# Patient Record
Sex: Male | Born: 1980 | Race: Black or African American | Hispanic: No | Marital: Single | Smoking: Current some day smoker
Health system: Southern US, Community
[De-identification: ages and names within clinical notes are randomized; demographics above are authoritative.]

---

## 2007-01-31 ENCOUNTER — Inpatient Hospital Stay (HOSPITAL_COMMUNITY): Admission: EM | Admit: 2007-01-31 | Discharge: 2007-02-02 | Payer: Self-pay | Admitting: Otolaryngology

## 2007-01-31 ENCOUNTER — Encounter: Payer: Self-pay | Admitting: Emergency Medicine

## 2007-01-31 ENCOUNTER — Encounter (INDEPENDENT_AMBULATORY_CARE_PROVIDER_SITE_OTHER): Payer: Self-pay | Admitting: Otolaryngology

## 2008-05-12 IMAGING — CT CT MAXILLOFACIAL W/O CM
1 series · 15 of 30 positions shown, 19 images · non-contrast
Comparison: CT face with contrast from 1001 hours the same day.

CLINICAL DATA: 26 year old male with suspected left maxillary sinusitis and superinfection of the inferior left orbit with history of prior left infraorbital trauma and plate reconstruction.  
MAXILLOFACIAL CT WITHOUT CONTRAST ? 01/31/07:
TECHNIQUE: Coronal and axial CT images were obtained through the maxillofacial region including the facial bones, orbits, and paranasal sinuses.  No intravenous contrast was administered.

[Series 2: stealth · axial · 0.49mm/px · z∈[+22,+190]mm · 15 of 144 slices shown, 19 images]
[im 5/144  brain]
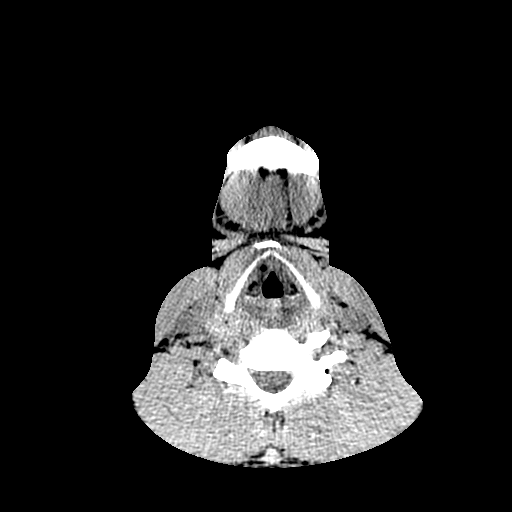
[im 5/144  bone]
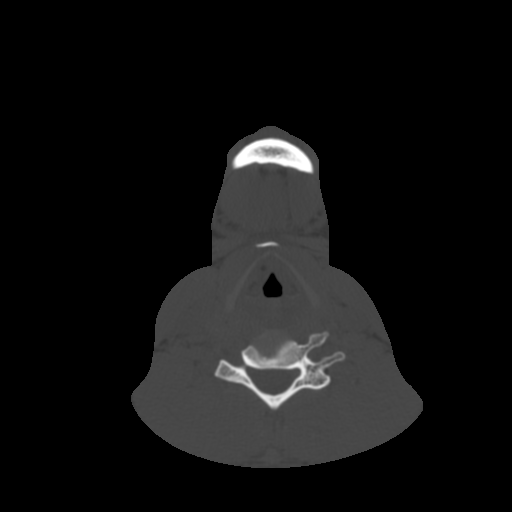
[im 15/144  bone]
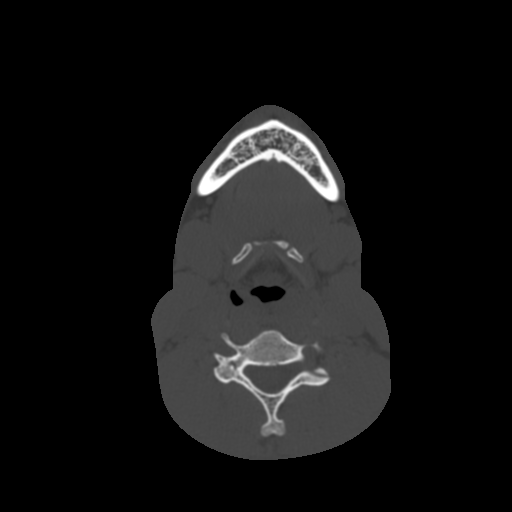
[im 25/144  bone]
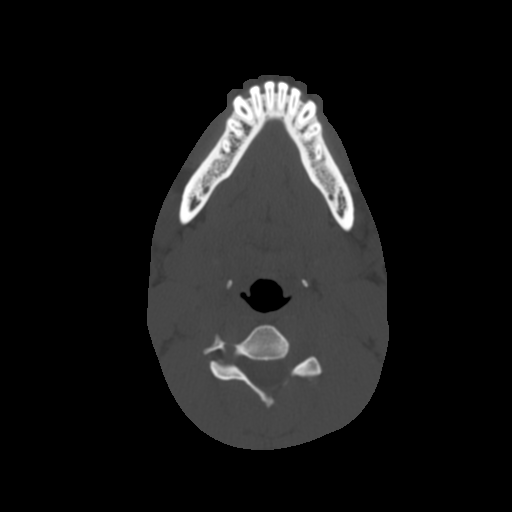
[im 35/144  bone]
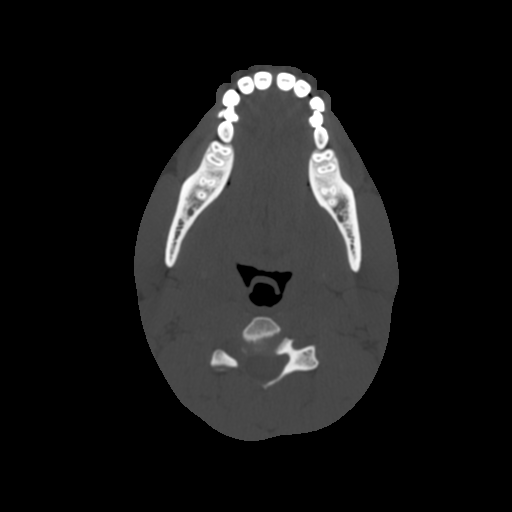
[im 45/144  brain]
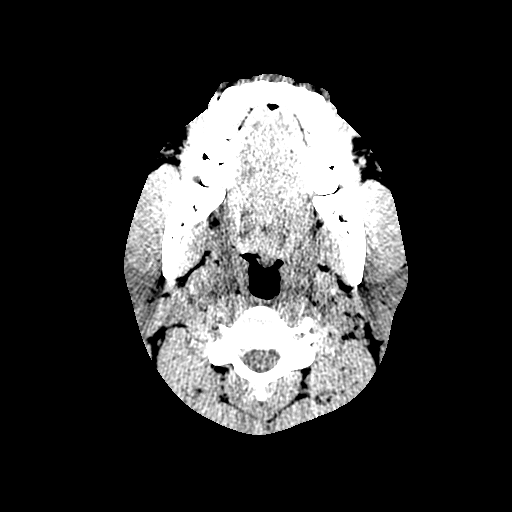
[im 45/144  bone]
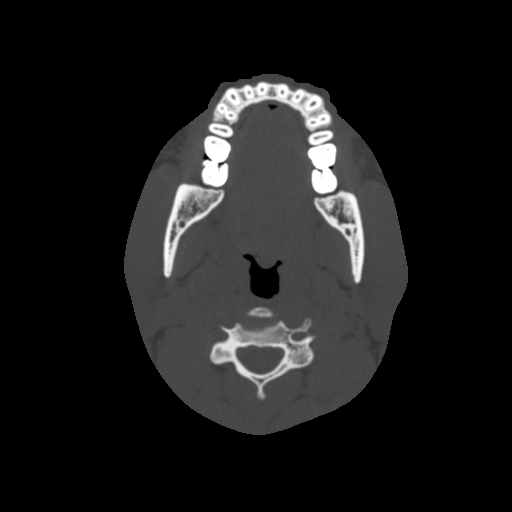
[im 55/144  bone]
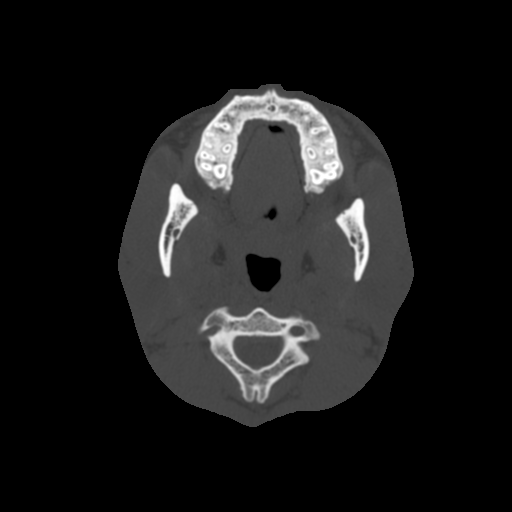
[im 65/144  bone]
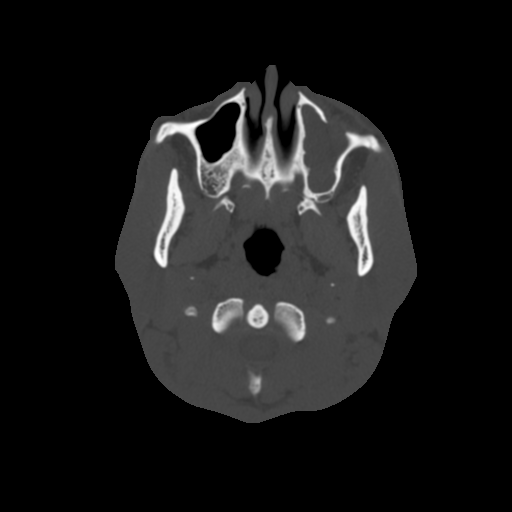
[im 74/144  bone]
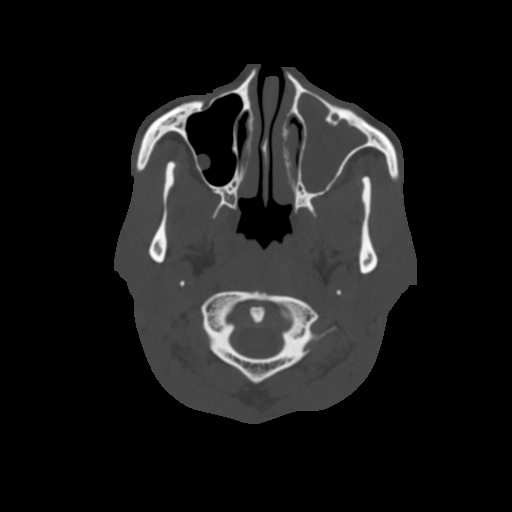
[im 79/144  brain]
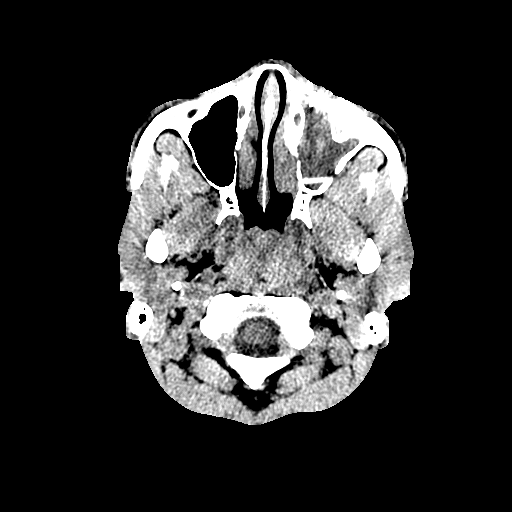
[im 79/144  bone]
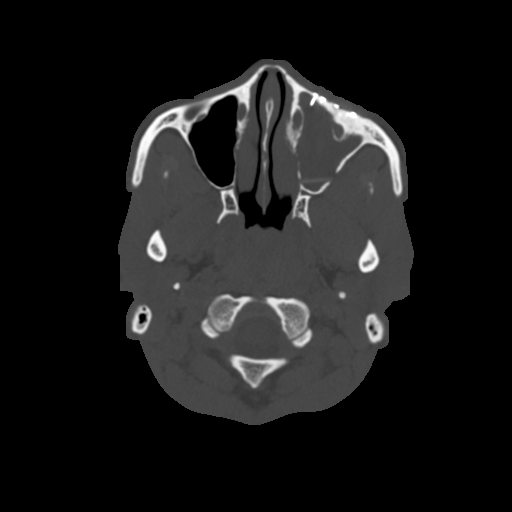
[im 89/144  bone]
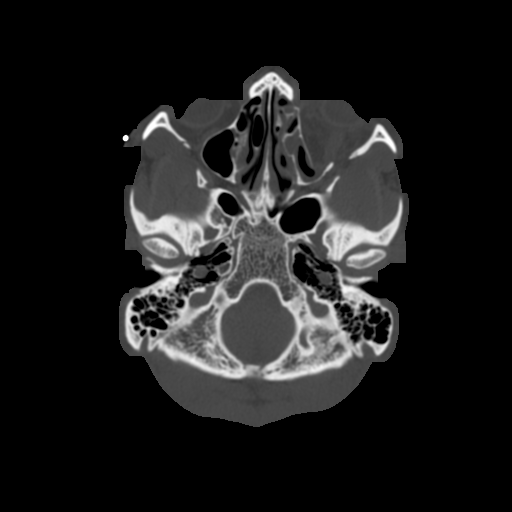
[im 99/144  bone]
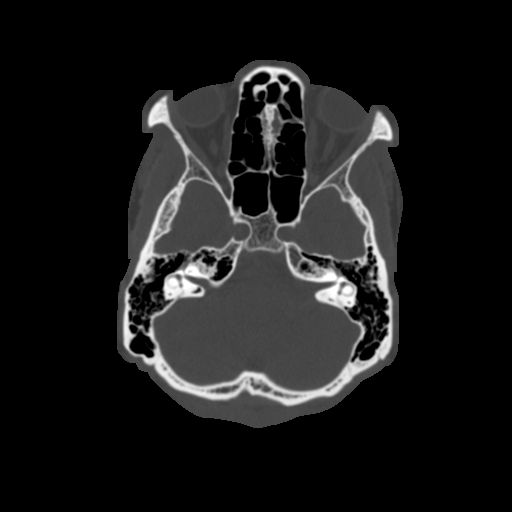
[im 109/144  bone]
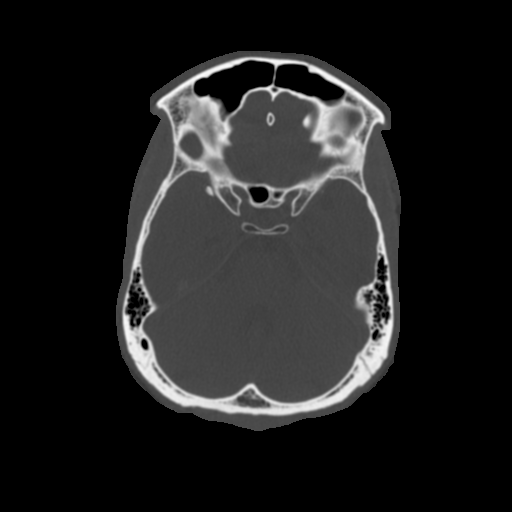
[im 119/144  brain]
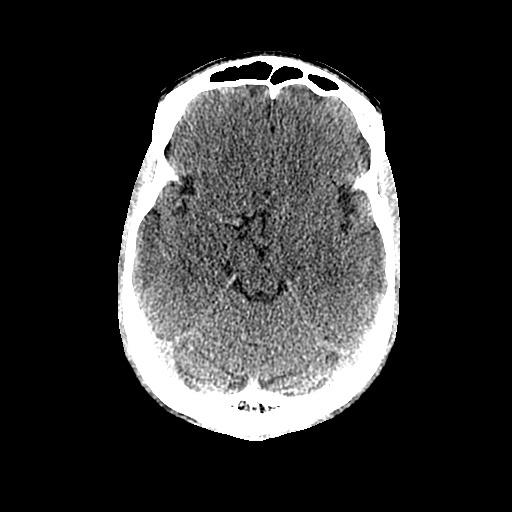
[im 119/144  bone]
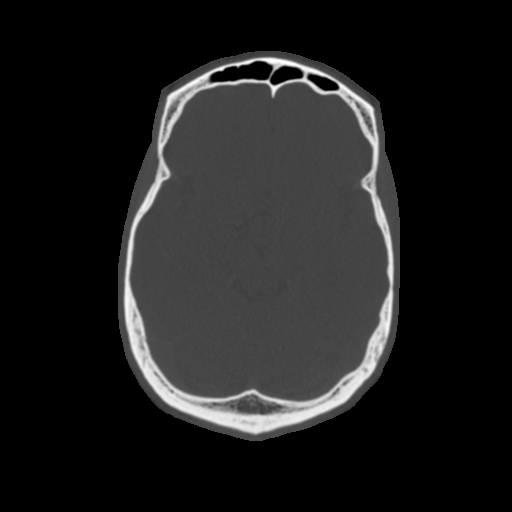
[im 129/144  bone]
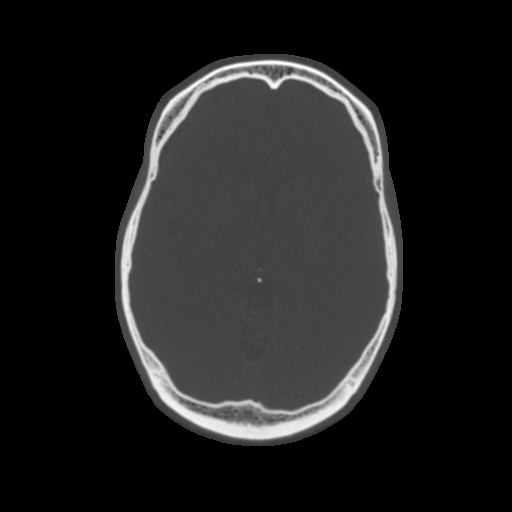
[im 139/144  bone]
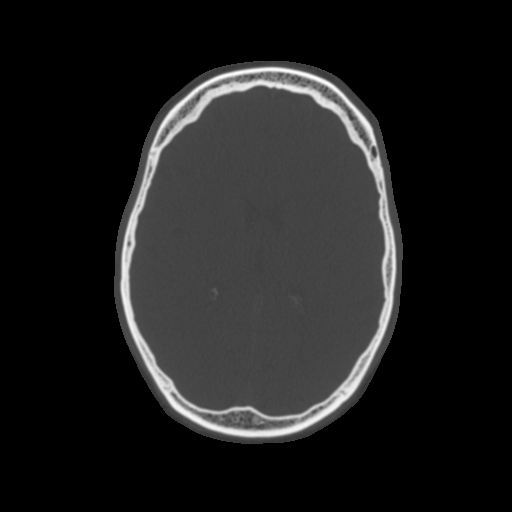

[15 of 30 positions shown; findings below may reference images not displayed]

FINDINGS: This noncontrasted study demonstrates complete opacification of the left maxillary sinus.  There is an osseous defect in the anterior aspect of the sinus which suggests a prior Deeqa Rayaan. The plate fixation of the left inferior orbit is reidentified along with some hyperdense material which may not be metallic which extends into the posterior-superior aspect of the left maxillary sinus.  Inflammatory changes involving the inferior rectus muscle on the left and inferior postseptal orbit were more apparent on the prior examination given coronal reformations.  The globes are intact and symmetric. The remainder of the left orbit appears within normal limits.  There is left premalar soft tissue swelling which is stable.  
A small mucus retention cyst in the right maxillary sinus is unchanged.  Left inferior ethmoid air cell opacification is stable. The remaining paranasal sinuses and mastoids are clear.  Visualized brain parenchyma remains normal.  The nasopharynx, oropharynx, hypopharynx, and larynx are normal.  Scattered cervical lymph nodes are not enlarged by CT size criteria.  Outside of the left inferior orbit and maxillary sinus, the visualized osseous structures are normal.
IMPRESSION: 1.  Stable left maxillary sinus opacification and inferior left orbital inflammatory changes surrounding the plate fixation of the left orbital floor fracture.
2.  Osseous defect in the anterior left maxillary sinus suggestive of prior Deeqa Rayaan procedure.

## 2008-05-12 IMAGING — CT CT MAXILLOFACIAL W/ CM
3 series · 16 of 41 positions shown, 19 images · IV contrast (omnipaque)
Comparison: none

CLINICAL DATA: History of inferior orbital wall blowout fracture on the left
about 5 years ago with surgical repair and  plating. Now with left eye swelling
and blurry vision.

MAXILLOFACIAL CT WITH CONTRAST
TECHNIQUE: Axial and coronal plane CT imaging was performed through the
maxillofacial structures following intravenous contrast administration.
Contrast:  100 cc Omnipaque 300

[Series 602: <mpr range> · coronal · 0.33mm/px · 3 of 108 slices shown]
[im 46/108  bone]
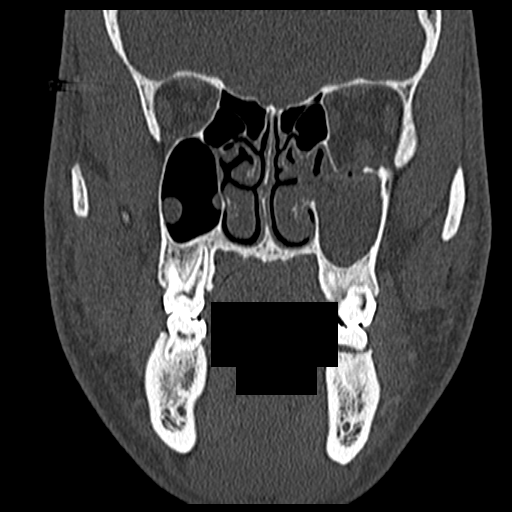
[im 54/108  bone]
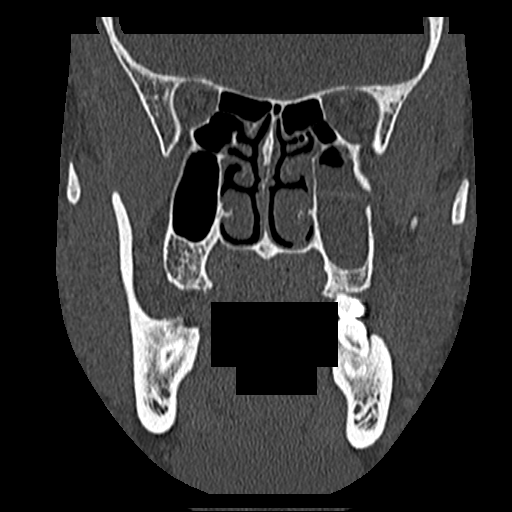
[im 62/108  bone]
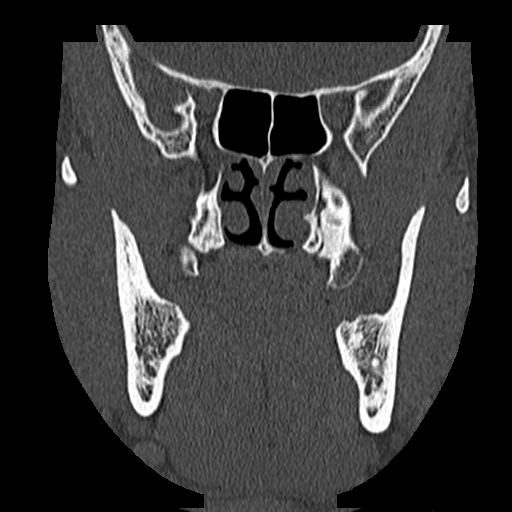

[Series 603: <mpr range(1)> · coronal · 0.33mm/px · 10 of 125 slices shown, 13 images]
[im 14/125  brain]
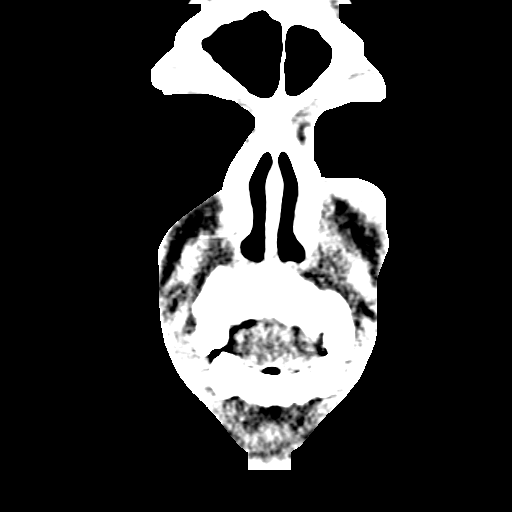
[im 14/125  bone]
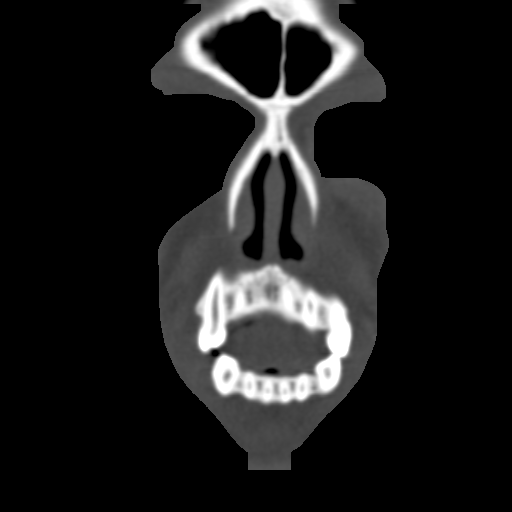
[im 28/125  bone]
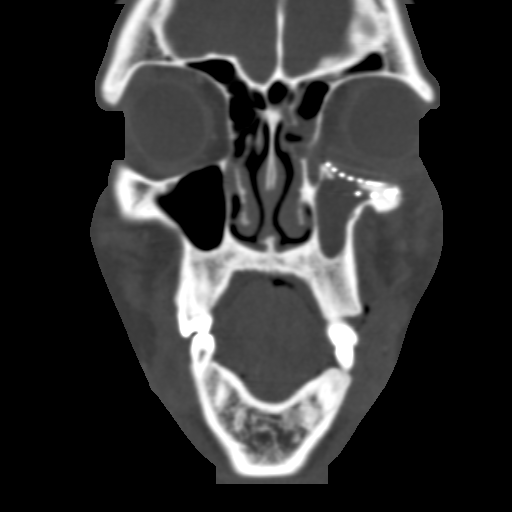
[im 32/125  bone]
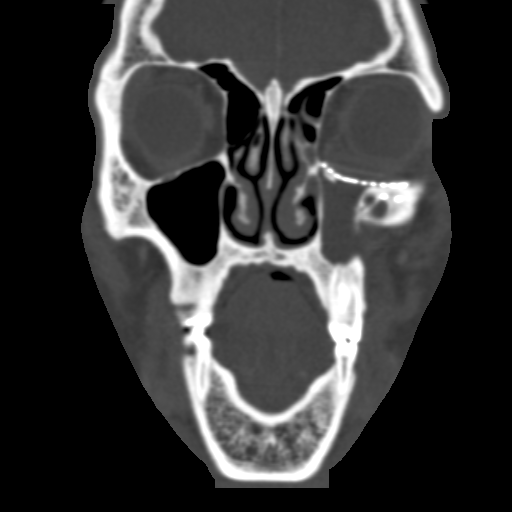
[im 42/125  bone]
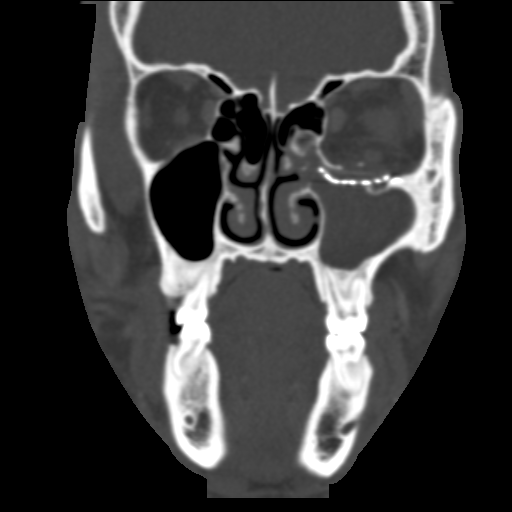
[im 56/125  brain]
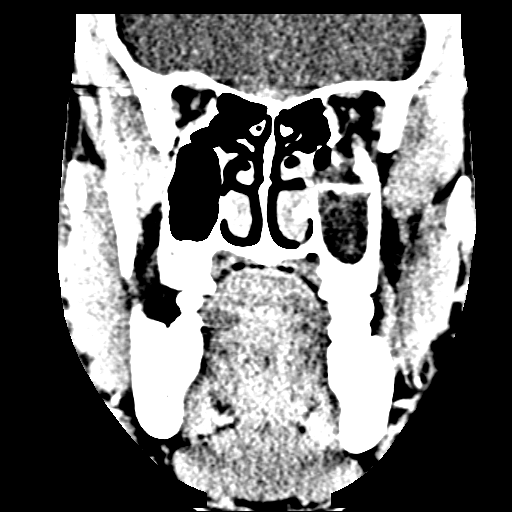
[im 56/125  bone]
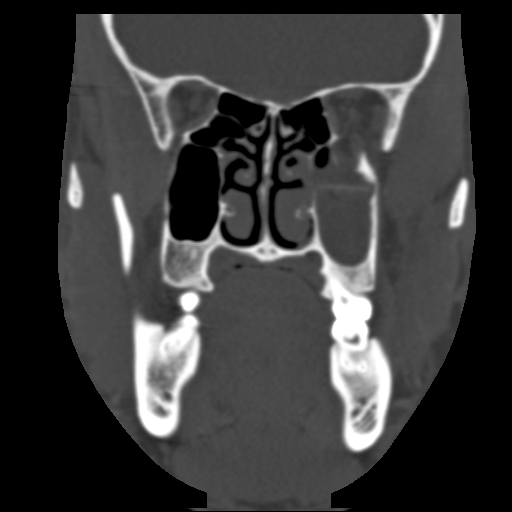
[im 69/125  bone]
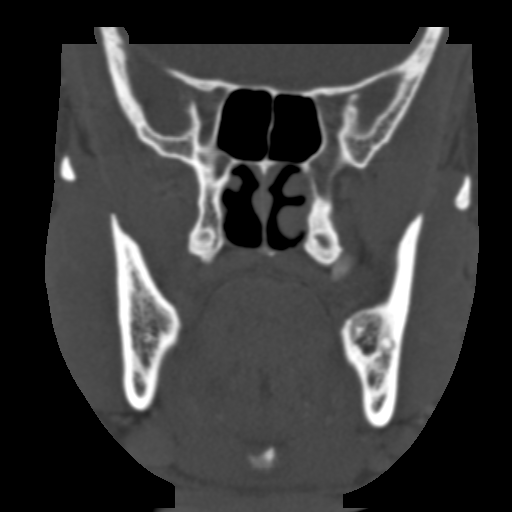
[im 83/125  bone]
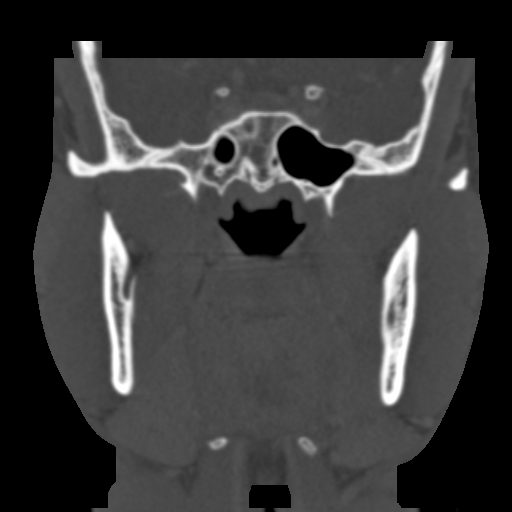
[im 94/125  bone]
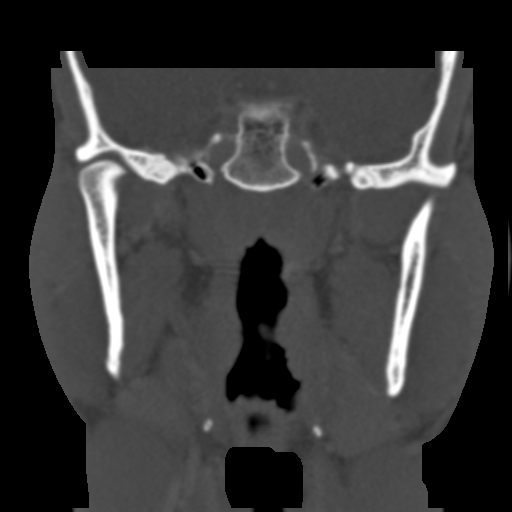
[im 97/125  brain]
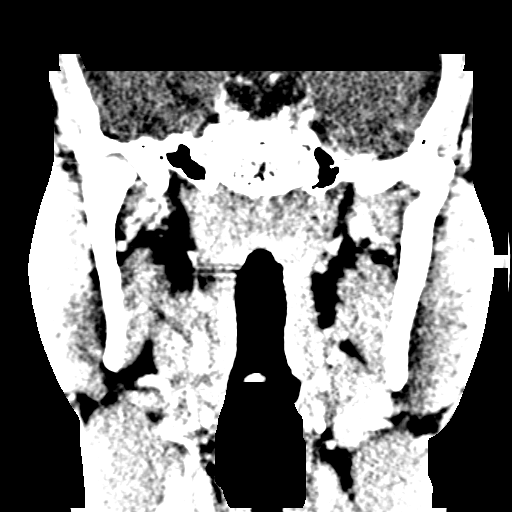
[im 97/125  bone]
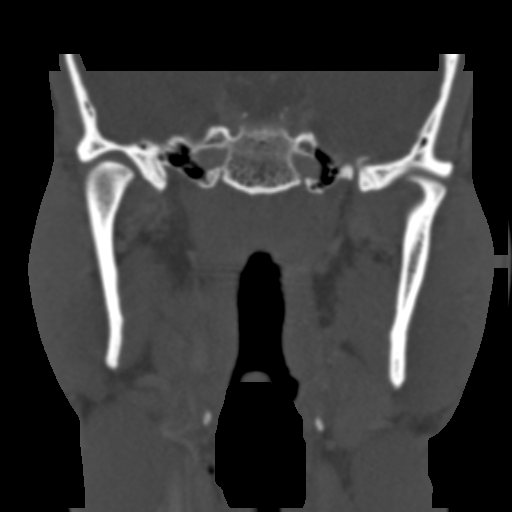
[im 111/125  bone]
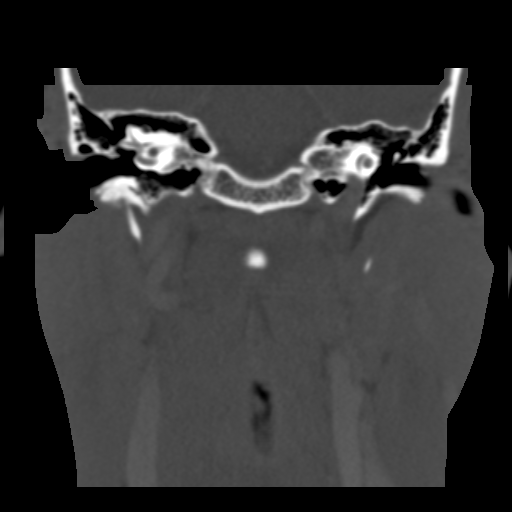

[Series 605: <mpr range(3)> · sagittal · 0.33mm/px · 3 of 146 slices shown]
[im 66/146  bone]
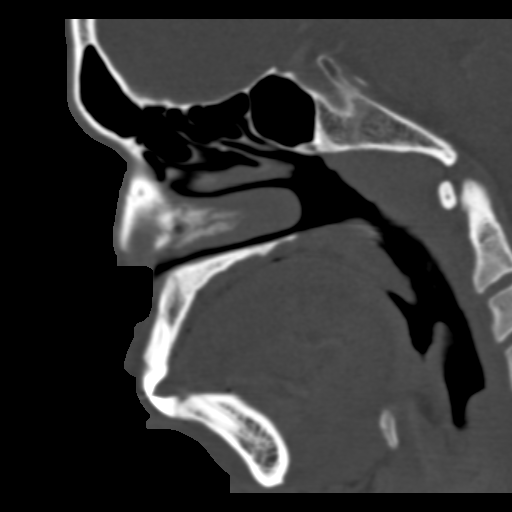
[im 73/146  bone]
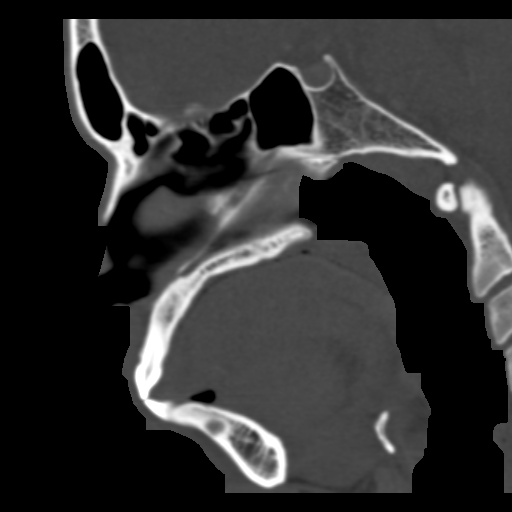
[im 80/146  bone]
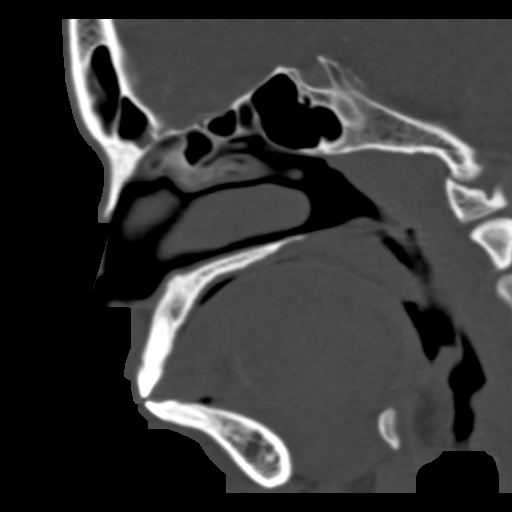

[16 of 41 positions shown; findings below may reference images not displayed]

FINDINGS: The left maxillary sinus is completely opacified. A defect in the
anterior wall of the left maxillary sinus appears chronic. The patient also has
a medial defect in the left maxillary sinus. There is an axially oriented plate
at the floor of the left orbit. A second surgical device can be seen extending
from the posterior maxillary sinus into the floor of the left orbit where it
extends anteriorly above the metal plate. Best seen on the coronal images, there
is rim enhancement around this surgical device seen cranial to the left orbital
floor plate. Within this abnormal enhancement and soft tissue attenuation are
several locules of air. These changes extend into the fat of the inferior left
orbit and are contiguous with the inferior rectus muscle which appears
thickened.

The right maxillary sinus, the frontal sinuses, and the sphenoid sinuses are
essentially clear except for a couple small polypoid foci of mucosal disease in
the left maxillary sinus.

IMPRESSION

Rim enhancement around the left orbital floor prosthesis. There appears to be in
surrounding rim of fluid around this device and several associated air locules
are evident. Given the complete opacification of the left maxillary sinus, this
is likely related to superinfection of the orbital fixation hardware/prosthesis
from the left maxillary sinus disease. These changes in the floor of the left
orbit are contiguous with the left inferior rectus muscle appears thickened and
irregular, likely secondary to irritation.

## 2010-08-26 NOTE — H&P (Signed)
NAME:  James Wise, James Wise              ACCOUNT NO.:  [id number]   MEDICAL RECORD NO.:  [id number]          PATIENT TYPE:  INP   LOCATION:  5703                         FACILITY:  [facility]   PHYSICIAN:  Kinnie Scales. Annalee Genta, M.D.DATE OF BIRTH:  1980/09/14   DATE OF ADMISSION:  01/31/2007  DATE OF DISCHARGE:                              HISTORY & PHYSICAL   ADMISSION DIAGNOSES:  1. History of left orbital trauma with orbital reconstruction.  2. Left maxillary sinusitis with orbital extension.   BRIEF HISTORY:  The patient is a 30 year old black male with a history  of progressive periorbital swelling and pain.  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  Surgery was performed at [facility] in 2003.  The patient recovered well from his surgery without  any long-term complications or problems.  He had normal vision and  normal gaze.  The patient has a recent 3-day history of upper  respiratory tract infection and presumed sinusitis with increasing pain  in the left periorbital and facial region.  The patient has low-grade  fever, mucopurulent nasal discharge and new onset swelling in the  external periorbital region with edema and pain, as well as some  limitation in lateral and downward gaze.  The patient denied any recent  orbital trauma, nausea history or sinusitis.  He has NO SIGNIFICANT  ALLERGIES or nasal congestion.  He is employed as a Chartered certified accountant.   FAMILY HISTORY:  The patient has no family.   SOCIAL HISTORY:  He is a nonsmoker.  He denies drugs use and drinks  minimal alcohol.   MEDICATIONS:  He takes no prescription medications.   REVIEW OF SYSTEMS:  Noncontributory.   PHYSICAL EXAMINATION:  GENERAL:  James Wise is a healthy-appearing 30-  year-old black male alert and oriented to self, place and time, in no  distress.  HEENT:  Ears show normal external auditory canals.  Tympanic  membranes:  TMs intact and mobile and no effusion or erythema.  Nasal cavity shows  moderate erythema in the mucosa with some mucoid nasal discharge.  Oral  cavity:  Oropharynx normal dentition.  Oral mucosa: Normal tongue and  palate and mobility; 2+ tonsils. Examination of the face shows swelling  and erythema of the left lower eyelid in the periorbital region.  He has  normal vision by his report but has limitation in gaze in the left eye  with downward and left lateral gaze.  Minimal diplopia.  NECK:  No lymphadenopathy or mass, normal thyroid gland to palpation.  CHEST:  Clear to auscultation.  HEART:  Normal rate and rhythm.  Normal heart sounds.  ABDOMEN:  Soft and nontender.  EXTREMITIES:  Normal.  SKIN:  Normal.   LABORATORY TESTING:  Performed in the [facility]  white blood cell count 7.1.  CT scan performed on the morning of  admission at [facility] shows the patient to be status post  prior orbital reconstruction from his orbital trauma.  He appears to  have titanium mesh along the  orbital rim extending within the anterior  aspect of the orbit and may have a nonmetallic orbital reconstruction  plate such as Silastic within the orbit and extending to the maxillary  sinus.  The entire left maxillary sinus is opacified as is the anterior  ethmoid region with extension to the frontal sinus and obstruction of  the osteomeatal complex on the left-hand side.  There is no evidence of  actual orbital involvement but there is some thickening of the soft  tissues and possible small amount of air within the anterior orbit.  There is no other sinusitis within the sphenoid sinus on the left or  sinus complex on the right.   IMPRESSION:  1. Status post orbital reconstruction from orbital fracture with      reconstruction plate.  2. Acute sinusitis with orbital extension of injection.   ASSESSMENT/PLAN:  James Wise presents today with a 3-day history of   progressive symptoms, periorbital infection.  He has undergone prior  orbital reconstruction after trauma with an orbital blow-out fracture  and his current complaint may represent extension infection directly  into the periorbital region or, perhaps, infected implant within the  orbit.  Given his history and progressive nature of his symptoms, we  have admitted him to the hospital and started him on intravenous  antibiotics.  We will obtain a stealth-formatted CT scan for  intraoperative computer-assisted navigation and I have recommended semi-  urgent surgical intervention to decompress the infected sinus and  possibly remove orbital implant material if necessary.  The risks,  benefits and possible complications to the procedure were discussed in  detail and the patient understands and concurs with our plan for surgery  which will be scheduled later today January 31, 2007.           ______________________________  Kinnie Scales. Annalee Genta, M.D.     DLS/MEDQ  D:  91/47/8295  T:  02/01/2007  Job:  [id number]

## 2010-08-26 NOTE — Discharge Summary (Signed)
NAME:  James Wise, James Wise              ACCOUNT NO.:  [id number]   MEDICAL RECORD NO.:  [id number]          PATIENT TYPE:  INP   LOCATION:  5703                         FACILITY:  [facility]   PHYSICIAN:  Kinnie Scales. Annalee Genta, M.D.DATE OF BIRTH:  01-24-1981   DATE OF ADMISSION:  01/31/2007  DATE OF DISCHARGE:  02/02/2007                               DISCHARGE SUMMARY   SERVICE:  ENT.   LOCATION:  [facility].   ADMISSION DIAGNOSES:  1. History of left orbital blowout fracture and reconstruction.  2. Acute left maxillary sinusitis with periorbital extension.   DISCHARGE DIAGNOSES:  1. History of left orbital blowout fracture and reconstruction.  2. Acute left maxillary sinusitis with periorbital extension.   SURGICAL PROCEDURES THIS ADMISSION:  Left endoscopic sinus surgery,  (January 31, 2007).   The patient discharged home in stable condition in the company of his  family.   DISCHARGE MEDICATIONS:  Include:  1. Augmentin 875 p.o. b.i.d. for 10 days.  2. Percocet 5/325 one to two tablets every 46 hours as needed for pain      dispense 30.   The patient is to avoid any lifting, straining or nasal trauma.  No nose  blowing.  He will use frequent nasal saline spray and return for  outpatient follow-up in my office on Tuesday, February 08, 2007   BRIEF HISTORY:  The patient is a 30 year old black male who presented to  the [facility] Emergency Department with acute periorbital  swelling and pain.  His history was significant for a prior orbital  blowout fracture which was repaired when the patient was in the [employer] in 2003.  He had no significant problems or visual issues until 3  days prior to admission when he developed an acute upper respiratory  tract infection with a left facial pain.  Gradually over the several  days prior to admission he developed a left periorbital swelling, soft  tissue erythema and limitation in gaze.  He was evaluated at the [facility] Emergency Department and CT scanning at that time showed  opacification of the left maxillary and ethmoid sinus with inflammatory  changes in the left orbit and swelling of the periorbital soft tissue.  Given the patient's history, examination and findings he was admitted to  [facility] for further evaluation, antibiotic therapy and  surgical treatment.   HOSPITAL COURSE:  The patient was admitted to the ENT service under Dr.  Thurmon Fair care on January 31, 2007.  A Stealth formatted CT scan of  the sinuses was obtained and the patient was taken the operating room in  the afternoon of January 31, 2007 after several doses of antibiotic  therapy.  At that time, the patient was found to have significant  obstruction of the left maxillary sinus with polypoid disease, a  Silastic implant was removed from the left periorbital region.  This had  dehisced into the sinus and may have been partially responsible for the  patient's development of a sinusitis.  The ethmoid, maxillary sinuses  were clear of disease.  The orbit was intact.  There was no evidence of  significant orbital dehiscence or active infection beyond the  periorbital soft tissues.  The patient was transferred from the  operating room to recovery room and then from recovery to unit 5700 for  postoperative care.   The patient was continued on broad-spectrum antibiotic therapy, Unasyn  3.1 grams IV q.6h.  Over the next 2 days he had a gradual improvement in  symptoms with decreased swelling and pain.  He was tolerating pain  control with a infrequent oral Percocet.  The periorbital erythema and  swelling had nearly resolved and his extraocular mobility limitation had  returned nearly to baseline.  The patient had normal visualization.  No  bleeding or discharge and he was discharged home in stable condition in  the company of his family with the above discharge instructions.            ______________________________  Kinnie Scales Annalee Genta, M.D.     DLS/MEDQ  D:  16/01/9603  T:  02/02/2007  Job:  [id number]

## 2010-08-26 NOTE — Op Note (Signed)
NAME:  James Wise, James Wise              ACCOUNT NO.:  [id number]   MEDICAL RECORD NO.:  [id number]          PATIENT TYPE:  INP   LOCATION:  5703                         FACILITY:  [facility]   PHYSICIAN:  Kinnie Scales. Annalee Genta, M.D.DATE OF BIRTH:  01/18/1981   DATE OF PROCEDURE:  01/31/2007  DATE OF DISCHARGE:                               OPERATIVE REPORT   PRE AND POSTOPERATIVE DIAGNOSIS AND INDICATIONS FOR SURGERY:   1. Status post left orbital blowout fracture with reconstruction.  2. Left acute sinusitis with orbital extension of infection.   SURGICAL PROCEDURES:  1. Left endoscopic sinus surgery with intraoperative computer      navigation (Stealth consisting of left total ethmoidectomy and      maxillary antrostomy, removal of diseased tissue).  2. Removal of Silastic orbital implant.   SURGEON:  Kinnie Scales. Annalee Genta, M.D.   ANESTHESIA:  General endotracheal.   COMPLICATIONS:  None.   ESTIMATED BLOOD LOSS:  Approximately 100 mL.   The patient transferred from the operating room to recovery room in  stable condition.   BRIEF HISTORY:  Mr. James Wise is a 30 year old black male who presented to  the [facility] emergency department early this morning January 31, 2007 for evaluation of a 3-day history of progressive left orbital  pain and periorbital swelling.  The patient has a significant history in  that area in 2003 he suffered an orbital blowout fracture as a result of  a trauma.  He underwent complex reconstruction at time of his injury  performed at the [facility] in 2003.  The patient  recovered fully and had no significant problems or complaints, no prior  history of sinusitis infection or significant allergies.  The patient  reports approximately three days prior to his presentation, he developed  acute upper respiratory tract infection with cold symptoms and nasal  congestion.  Over the next three days,  this progressed with increasing  left facial  and periorbital pain, low grade fever and sinonasal  discharge.  The patient developed swelling in the left lower eyelid and  periorbital region and with increasing symptoms, including some visual  changes.  He presented to [facility] emergency department for further  evaluation and treatment.  In the emergency department a CT scan was  obtained and the patient was found to have opacification of the left  maxillary sinus which extended to the ethmoid region and blocked sinus  drainage pathway.  He had previous reconstruction as noted before and a  presumed periorbital involvement of his sinus infection.  Given his  history and examination, he was admitted to [facility] for  intravenous antibiotics and further workup and treatment.  With a  significant nature of this findings, I recommended semiemergent surgical  intervention to decompress left maxillary ethmoid sinus.  Prior to  surgery a Stealth computer assisted navigation scan was obtained and the  Stealth device was used throughout the surgical procedure.  The risks,  benefits and possible complications of the above surgeries were  discussed in detail including potential issues with vision and  extraocular mobility related to  his previous implant and ongoing  infection.  The patient understood and concurred with our plan for  surgery which is scheduled for January 31, 2007.   PROCEDURE:  The patient brought to the operating room at [facility] OR on January 31, 2007.  He is position on the operating  table, prepped and draped in sterile fashion.  The patient was injected  with a total of 3 mL of 1% lidocaine 1:100,000 dilution epinephrine  injected subcutaneous fashion on the left lateral nasal wall, uncinate  process and middle turbinate.  The patient's nose then packed with Afrin  soaked cottonoid pledgets which were left in place approximately 10  minutes to allow for vasoconstriction and hemostasis.  The  Stealth  navigation headgear was then applied and the stealth device was used  throughout the surgical procedure.  Anatomic and surgical landmarks were  identified, confirmed and the device was utilized to improve surgical  accuracy.  The patient then prepped and draped in sterile fashion,  positioned on the operating table and surgical procedure was begun.   The patient's nasal cavity was thoroughly evaluated.  He had thick  mucopurulent material within the middle meatus on the left-hand side.  Gentle palpation of the lateral nasal wall and uncinate process  expressed a moderate lot of purulent material.  The natural ostium of  the left maxillary sinus was completely scarred closed and I was unable  to visualize the actual opening.  The sinus was then evaluated using  endoscopy and the navigation tool and posterior fontanelle sinuplasty  was performed.  Dissection was then carried out along the lateral nasal  wall and uncinate process, resecting thickened mucosa and diseased  tissue in order to create a widely patent left maxillary antrostomy.  There was polypoid disease material within the maxillary sinus and a  moderate amount of thick mucopurulent material which was irrigated and  suctioned.  Within the left maxillary sinus, the patient is noted to  have a soft plastic foreign body consistent with an orbital implant.  This was gently removed and did not appear to be attached to the  intraorbital structures.  The floor of the orbit appeared to be intact  and there was no evidence of orbital fat or herniation in the left  maxillary sinus.  The titanium implant was fully integrated and was not  visualized during the procedure.  With maxillary ostium opened  dissection then carried out along the left ethmoid region dissecting the  anterior ethmoid air cells, removing ethmoid bulla and extending into  the nasal frontal recess.  Dissection then carried posteriorly into the  posterior  ethmoid region, completely removing bony septations and  diseased mucosa.  There was moderate lot of polypoid mucosa and thick  mucus secretions which were cleared from the sinus.  The patient's  ethmoid and maxillary sinus were then gently irrigated and suctioned.  An orogastric tube was passed.  The stomach contents were aspirated.  The patient was then awakened from his anesthetic.  There is no packing  placed.  Bleeding was 100 mL.  There no complications.  He was  extubated, transferred from the operating room to recovery in stable  condition.           ______________________________  Kinnie Scales Annalee Genta, M.D.     DLS/MEDQ  D:  04/54/0981  T:  02/01/2007  Job:  [id number]

## 2011-01-21 LAB — CULTURE, BLOOD (ROUTINE X 2)

## 2011-01-21 LAB — DIFFERENTIAL
Basophils Absolute: 0
Eosinophils Relative: 3
Lymphs Abs: 1.7
Monocytes Relative: 7

## 2011-01-21 LAB — BASIC METABOLIC PANEL
CO2: 23
GFR calc Af Amer: >60
GFR calc non Af Amer: >60
Glucose, Bld: 93
Potassium: 3.3 — ABNORMAL LOW
Sodium: 132 — ABNORMAL LOW

## 2011-01-21 LAB — CBC
Hemoglobin: 14.4
Platelets: 172
WBC: 7.1

## 2020-12-12 ENCOUNTER — Other Ambulatory Visit: Payer: Self-pay

## 2020-12-12 ENCOUNTER — Ambulatory Visit (HOSPITAL_COMMUNITY)
Admission: EM | Admit: 2020-12-12 | Discharge: 2020-12-12 | Disposition: A | Discharge status: Home / Self Care | Payer: Medicaid Other | Attending: Psychiatry | Admitting: Psychiatry

## 2020-12-12 DIAGNOSIS — F4323 Adjustment disorder with mixed anxiety and depressed mood: Secondary | ICD-10-CM | POA: Diagnosis not present

## 2020-12-12 NOTE — ED Provider Notes (Signed)
Behavioral Health Urgent Care Medical Screening Exam  Patient Name: James Wise: [id number] Date of Evaluation: 12/12/20 Chief Complaint:  reports that he is "fed up with everything; life situation, marriage situation and being able to see my child". Pt denies SI, however reports having thoughts about wanting to kill "the last person that pissed me off". Pt states that he is not planning to harm anyone just having thoughts. Pt reports feeling paranoid like the police and neighbors are watching him due to recently "snapping" on them. Pt denies AVH and reports CBD use last night. Pt reports back pain. Pt left military in 2005. Pt a little agitated. Pt is URGENT. Diagnosis:  Final diagnoses:  Adjustment disorder with mixed anxiety and depressed mood    History of Present illness: James Wise is a 40 y.o. male Patient presents to the [facility] voluntarily as a walk-in  referred by [facility] service office for an evaluation.    Patient seen and evaluated face-to-face by this provider along with the TTS counselor. On evaluation, the patient is alert and oriented x4. His thought process is logical and speech is coherent. He appears somewhat irritable but is able to maintain his composure. He denies SI/HI/AVH. He does not appear to be responding to internal or external stimuli. He reports that [facility] trying to [facility] on things but continues to get the run around. He states that he has not spoken to a therapist in a long time and his medications are not working. He reports taking sertraline, fluoxetine, and Minipress. He states that he tried melatonin for sleep and that did not work. He states that he took a friend's Ambien pill once and that did help with sleep. He also reports that he has been off his medications for a period but also reports the medications made him feel like a zombie. Patient reports stressors related to his marital status,  not being able to see his child and issues with dealing with certain people. He describes his depressive symptoms as sadness, hopelessness, decreased energy level, crying spells, isolating, irritability, and a lack of interest and pleasure in activities. He reports having depressive episodes a couple of times per week. He describes his anxiety symptoms as excessive worrying, decreased energy, feeling in a sense of danger, fast heartbeat and isolating.  Patient denies history of psychiatric hospitalization.  Patient reports that he lives alone and appears to have some issues with housing. 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 Patient reports having legal issues but does not want to share what legal issues he has going on. Patient reports substance use of THC 2-3 times a week.   He reports that he was following up with the [facility] in [place] but since moving to [place] he has not established outpatient services.   Psychiatric Specialty Exam  Presentation  General Appearance:Appropriate for Environment  Eye Contact:Fair  Speech:Clear and Coherent  Speech Volume:Normal  Handedness:Right   Mood and Affect  Mood:Dysphoric  Affect:Congruent   Thought Process  Thought Processes:Coherent; Goal Directed  Descriptions of Associations:Intact  Orientation:Full (Time, Place and Person)  Thought Content:Logical    Hallucinations:None  Ideas of Reference:None  Suicidal Thoughts:No  Homicidal Thoughts:No   Sensorium  Memory: No data recorded Judgment:Intact  Insight:Fair   Executive Functions  Concentration:Fair  Attention Span:Fair  Recall:Fair  Fund of Knowledge:Fair  Language:Fair   Psychomotor Activity  Psychomotor Activity:Normal  Assets  Assets:Communication Skills; Desire for Improvement; Housing; Leisure Time; Physical Health   Sleep  Sleep:Poor  Number of hours:  No data recorded  No data  recorded  Physical Exam: Physical Exam HENT:     Head: Normocephalic.     Nose: Nose normal.  Eyes:     Conjunctiva/sclera: Conjunctivae normal.  Cardiovascular:     Rate and Rhythm: Bradycardia present.  Pulmonary:     Effort: Pulmonary effort is normal.  Musculoskeletal:        General: Normal range of motion.     Cervical back: Normal range of motion.  Neurological:     Mental Status: He is alert and oriented to person, place, and time.   Review of Systems  Constitutional: Negative.   HENT: Negative.    Eyes: Negative.   Respiratory: Negative.    Cardiovascular: Negative.   Gastrointestinal: Negative.   Genitourinary: Negative.   Musculoskeletal: Negative.   Skin: Negative.   Neurological: Negative.   Endo/Heme/Allergies: Negative.   Psychiatric/Behavioral:  Positive for depression. Negative for suicidal ideas. The patient is nervous/anxious.   Blood pressure (!) 138/95, pulse (!) 57, temperature 98.2 F (36.8 C), temperature source Oral, resp. rate 18, height 6' (1.829 m), weight 176 lb (79.8 kg), SpO2 100 %. Body mass index is 23.87 kg/m.  Musculoskeletal: Strength & Muscle Tone: within normal limits Gait & Station: normal Patient leans: N/A   [facility] MSE Discharge Disposition for Follow up and Recommendations: Based on my evaluation the patient does not appear to have an emergency medical condition and can be discharged with resources and follow up care in outpatient services for Medication Management, Individual Therapy, and Group Therapy  Follow up with the [facility] for medication management and therapy.   Laurier Jasperson L, NP 12/12/2020, 5:25 PM

## 2020-12-12 NOTE — Discharge Instructions (Addendum)
Take all of you medications as prescribed by your mental healthcare provider. Report any adverse effects and reactions from your medications to your outpatient provider promptly. Do not engage in alcohol and or illegal drug use while on prescription medicines.  Keep all scheduled appointments. This is to ensure that you are getting refills on time and to avoid any interruption in your medication.  f you are unable to keep an appointment call to reschedule.  Be sure to follow up with resources and follow ups given.  In the event of worsening symptoms call the crisis hotline, 911, and or go to the nearest emergency department for appropriate evaluation and treatment of symptoms. Follow-up with your primary care provider for your medical issues, concerns and or health care needs.

## 2020-12-12 NOTE — BH Assessment (Signed)
Comprehensive Clinical Assessment (CCA) Note  12/12/2020 James Wise [id number]  Disposition: Per Liborio Nixon, NP, patient is psychiatrically cleared and recommended for discharge.   Flowsheet Row ED from 12/12/2020 in [facility]  C-SSRS RISK CATEGORY No Risk       The patient demonstrates the following risk factors for suicide: Chronic risk factors for suicide include: psychiatric disorder of PTSD . Acute risk factors for suicide include: family or marital conflict. Protective factors for this patient include: responsibility to others (children, family). Considering these factors, the overall suicide risk at this point appears to be low. Patient is appropriate for outpatient follow up.   James Wise is a 40 year old male presenting to [facility] voluntarily referred by [facility] service office. Patient reports that he was at [facility] trying to get service but was getting "the run around". Patient reports he was trying to get rental assistance and SNAP benefits. Patient reports he was talking to receptionist and Dulcy Fanny overheard him and recommended he come here to talk withs someone. Patient reports that he moved to [place] about a 1 year ago from [place]. Patient reports he was seen outpatient at the [facility] in [place], but he hasn't seen his therapist for the longest time. Patient also reports that he has been off his medications for a period but also reports the medications made him feel like a zombie. Patient reports stressors related to his marital status, not being able to see his child and issues with dealing with certain people.   Patient reports diagnosis of PTSD, anxiety and depression and was being treated at [facility]. Patient acknowledges worsening anxiety and depression and symptoms of feeling sad, low energy anhedonia, crying, feeling irritable, hopeless and poor sleep. Patient does not have established care in [place] and is interested in services. Patient denies history of  psychiatric hospitalization.  Patient reports that he lives alone and appears to have some issues with housing. 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 Patient reports having legal issues but does not want to share what legal issues he has going on. Patient reports substance use of THC 2-3 times a week.   Patient is alert, engaged, cooperative but guarded at times during assessment. Patient eye contact is good, speech is clear and coherent, and he presents somewhat agitated. Patient denies SI, HI, AVH. Patient reports having thoughts of wanting to hurt people but denies plan or intent to harm anyone and no person identified. Patient reports having feelings that people are looking at him weird and reports some conflict with neighbors. Patient denies history of violence and suicide attempts.    Chief Complaint:  Chief Complaint  Patient presents with   Evaluation    Visit Diagnosis: Adjustment disorder with mixed anxiety and depressed mood    CCA Screening, Triage and Referral (STR)  Patient Reported Information How did you hear about [place]? No data recorded What Is the Reason for Your Visit/Call Today? Pt reports that he is "fed up with everything; life situaiton, marriage situation and being able to see my child". Pt denies SI, however reports having thoughts about wanting to kill "the last person that pissed me off". Pt states that he is not planning to harm anyone just having thoughts. Pt reports feeling parnaoid like the police and neighbors are watching him due to recently "snapping" on them. Pt denies AVH and reports CBD use last night. Pt reports back pain. Pt left military in 2005. Pt is  urgent.  How Long Has This Been Causing You Problems? 1 wk - 1 month  What Do You Feel Would Help You the Most Today? Treatment for Depression or other mood problem   Have You Recently Had Any Thoughts About Hurting Yourself? No  Are You Planning to Commit  Suicide/Harm Yourself At This time? No   Have you Recently Had Thoughts About Hurting Someone Karolee Ohslse? Yes  Are You Planning to Harm Someone at This Time? No  Explanation: No data recorded  Have You Used Any Alcohol or Drugs in the Past 24 Hours? Yes  How Long Ago Did You Use Drugs or Alcohol? No data recorded What Did You Use and How Much? No data recorded  Do You Currently Have a Therapist/Psychiatrist? No data recorded Name of Therapist/Psychiatrist: No data recorded  Have You Been Recently Discharged From Any Office Practice or Programs? No data recorded Explanation of Discharge From Practice/Program: No data recorded    CCA Screening Triage Referral Assessment Type of Contact: No data recorded Telemedicine Service Delivery:   Is this Initial or Reassessment? No data recorded Date Telepsych consult ordered in [facility]:  No data recorded Time Telepsych consult ordered in [facility]:  No data recorded Location of Assessment: No data recorded Provider Location: No data recorded  Collateral Involvement: No data recorded  Does Patient Have a Court Appointed Legal Guardian? No data recorded Name and Contact of Legal Guardian: No data recorded If Minor and Not Living with Parent(s), Who has Custody? No data recorded Is CPS involved or ever been involved? No data recorded Is APS involved or ever been involved? No data recorded  Patient Determined To Be At Risk for Harm To Self or Others Based on Review of Patient Reported Information or Presenting Complaint? No data recorded Method: No data recorded Availability of Means: No data recorded Intent: No data recorded Notification Required: No data recorded Additional Information for Danger to Others Potential: No data recorded Additional Comments for Danger to Others Potential: No data recorded Are There Guns or Other Weapons in Your Home? No data recorded Types of Guns/Weapons: No data recorded Are These Weapons Safely Secured?                             No data recorded Who Could Verify You Are Able To Have These Secured: No data recorded Do You Have any Outstanding Charges, Pending Court Dates, Parole/Probation? No data recorded Contacted To Inform of Risk of Harm To Self or Others: No data recorded   Does Patient Present under Involuntary Commitment? No data recorded IVC Papers Initial File Date: No data recorded  [place] data recorded  Patient Currently Receiving the Following Services: No data recorded  Determination of Need: Urgent (48 hours)   Options For Referral: Medication Management; Outpatient Therapy     CCA Biopsychosocial Patient Reported Schizophrenia/Schizoaffective Diagnosis in Past: No   Strengths: No data recorded  Mental Health Symptoms Depression:   Change in energy/activity; Difficulty Concentrating; Irritability; Sleep (too much or little)   Duration of Depressive symptoms:  Duration of Depressive Symptoms: Greater than two weeks   Mania:   N/A   Anxiety:    Worrying; Tension   Psychosis:   None   Duration of Psychotic symptoms:    Trauma:   Hypervigilance; Irritability/anger   Obsessions:   None   Compulsions:   None   Inattention:   None   Hyperactivity/Impulsivity:  None   Oppositional/Defiant Behaviors:   None   Emotional Irregularity:   None   Other Mood/Personality Symptoms:  No data recorded   Mental Status Exam Appearance and self-care  Stature:   Average   Weight:   Average weight   Clothing:   Neat/clean; Age-appropriate   Grooming:   Normal   Cosmetic use:   None   Posture/gait:   Normal   Motor activity:   Not Remarkable   Sensorium  Attention:   Normal   Concentration:   Normal   Orientation:   X5   Recall/memory:   Normal   Affect and Mood  Affect:   Full Range   Mood:   Euthymic   Relating  Eye contact:   Normal   Facial expression:   Responsive   Attitude toward examiner:    Cooperative; Guarded   Thought and Language  Speech flow:  Clear and Coherent   Thought content:   Appropriate to Mood and Circumstances   Preoccupation:   None   Hallucinations:   None   Organization:  No data recorded  [employer] of Knowledge:   Good   Intelligence:   Average   Abstraction:   Normal   Judgement:   Fair   Dance movement psychotherapist:   Adequate   Insight:   Good   Decision Making:   Normal   Social Functioning  Social Maturity:   Responsible   Social Judgement:   Normal   Stress  Stressors:   Family conflict; Work; Transitions; Housing   Coping Ability:   Overwhelmed; Exhausted   Skill Deficits:   None   Supports:   Support needed     Religion:    Leisure/Recreation:    Exercise/Diet: Exercise/Diet Do You Have Any Trouble Sleeping?: Yes   CCA Employment/Education Employment/Work Situation: Employment / Work Situation Employment Situation: Employed Work Stressors: work stressful due to co-workers Has Patient ever Been in Equities trader?: Yes (Describe in comment) Did You Receive Any Psychiatric Treatment/Services While in Equities trader?: Yes Type of Psychiatric Treatment/Services in [employer]: [facility] [place]  Education:     CCA Family/Childhood History Family and Relationship History: Family history Marital status: Separated Does patient have children?: Yes How is patient's relationship with their children?: unable to see child  Childhood History:     Child/Adolescent Assessment:     CCA Substance Use Alcohol/Drug Use: Alcohol / Drug Use Pain Medications: SEE MAR Prescriptions: SEE MAR Over the Counter: SEE MAR History of alcohol / drug use?: Yes Substance #1 Name of Substance 1: THC 1 - Frequency: twice a week 1 - Duration: ongoing                       ASAM's:  Six Dimensions of Multidimensional Assessment  Dimension 1:  Acute Intoxication and/or Withdrawal Potential:       Dimension 2:  Biomedical Conditions and Complications:      Dimension 3:  Emotional, Behavioral, or Cognitive Conditions and Complications:     Dimension 4:  Readiness to Change:     Dimension 5:  Relapse, Continued use, or Continued Problem Potential:     Dimension 6:  Recovery/Living Environment:     ASAM Severity Score:    ASAM Recommended Level of Treatment: ASAM Recommended Level of Treatment: Level I Outpatient Treatment   Substance use Disorder (SUD)    Recommendations for Services/Supports/Treatments: Recommendations for Services/Supports/Treatments Recommendations For Services/Supports/Treatments: Medication Management, Individual Therapy  Discharge  Disposition: Discharge Disposition Medical Exam completed: Yes Disposition of Patient: Discharge Mode of transportation if patient is discharged/movement?: Car  DSM5 Diagnoses: There are no problems to display for this patient.    Referrals to Alternative Service(s): Referred to Alternative Service(s):   Place:   Date:   Time:    Referred to Alternative Service(s):   Place:   Date:   Time:    Referred to Alternative Service(s):   Place:   Date:   Time:    Referred to Alternative Service(s):   Place:   Date:   Time:     Audree Camel, [facility]

## 2020-12-12 NOTE — BH Assessment (Signed)
Pt reports that he is "fed up with everything; life situaiton, marriage situation and being able to see my child". Pt denies SI, however reports having thoughts about wanting to kill "the last person that pissed me off". Pt states that he is not planning to harm anyone just having thoughts. Pt reports feeling parnaoid like the police and neighbors are watching him due to recently "snapping" on them. Pt denies AVH and reports CBD use last night. Pt reports back pain. Pt left military in 2005. Pt a little agitated. Pt is urgent.

## 2020-12-13 ENCOUNTER — Ambulatory Visit (HOSPITAL_COMMUNITY)
Admission: EM | Admit: 2020-12-13 | Discharge: 2020-12-13 | Disposition: A | Discharge status: Home / Self Care | Payer: Medicaid Other | Attending: Psychiatry | Admitting: Psychiatry

## 2020-12-13 DIAGNOSIS — F4310 Post-traumatic stress disorder, unspecified: Secondary | ICD-10-CM | POA: Insufficient documentation

## 2020-12-13 DIAGNOSIS — Z564 Discord with boss and workmates: Secondary | ICD-10-CM | POA: Insufficient documentation

## 2020-12-13 DIAGNOSIS — F32A Depression, unspecified: Secondary | ICD-10-CM | POA: Insufficient documentation

## 2020-12-13 DIAGNOSIS — F4320 Adjustment disorder, unspecified: Secondary | ICD-10-CM | POA: Insufficient documentation

## 2020-12-13 NOTE — Discharge Instructions (Addendum)
   Please come to [facility] (this facility) during walk in hours for appointment with psychiatrist for further medication management and for therapists for therapy.    Walk in hours are 8-11 AM Monday through Thursday for medication management. Therapy walk in hours are Monday-Wednesday 8 AM-1PM.   It is first come, first -serve; it is best to arrive by 7:00 AM.   On Friday from 1 pm to 4 pm for therapy intake only. Please arrive by 12:00 pm as it is  first come, first -serve.    When you arrive please go upstairs for your appointment. If you are unsure of where to go, inform the front desk that you are here for a walk in appointment and they will assist you with directions upstairs.  Address:  [address], in [place], [address] Ph: [phone]   

## 2020-12-13 NOTE — Progress Notes (Signed)
AVS reviewed with patient.  All questions answered.  Patient ambulated without issue to lobby.  Discharged in stable condition; no acute distress noted.
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
## 2020-12-13 NOTE — Progress Notes (Signed)
   12/13/20 1500  [facility] Triage Screening (Walk-ins at [facility] only)  What Is the Reason for Your Visit/Call Today? Patient was seen at [facility] yesterday and is requesting to see a provider. Patient reported, "people at my job are trying to trigger me." Patient denied suicidal/homicidal ideations and denied auditory/visual hallucinations.  How Long Has This Been Causing You Problems? <Week  Have You Recently Had Any Thoughts About Hurting Yourself? No  Are You Planning to Commit Suicide/Harm Yourself At This time? No  Have you Recently Had Thoughts About Hurting Someone Karolee Ohs? No  Are You Planning To Harm Someone At This Time? No  Are you currently experiencing any auditory, visual or other hallucinations? No  Have You Used Any Alcohol or Drugs in the Past 24 Hours? No  How long ago did you use Drugs or Alcohol? CBD yesterday  Do you have any current medical co-morbidities that require immediate attention? Yes  Please describe current medical co-morbidities that require immediate attention: back problems  Clinician description of patient physical appearance/behavior: pleasant and cooperative  What Do You Feel Would Help You the Most Today? Medication(s)  If access to [facility] Urgent Care was not available, would you have sought care in the Emergency Department? No  Options For Referral Medication Management

## 2020-12-16 ENCOUNTER — Ambulatory Visit (HOSPITAL_COMMUNITY)
Admission: EM | Admit: 2020-12-16 | Discharge: 2020-12-16 | Disposition: A | Discharge status: Home / Self Care | Payer: Medicaid Other | Attending: Nurse Practitioner | Admitting: Nurse Practitioner

## 2020-12-16 ENCOUNTER — Other Ambulatory Visit: Payer: Self-pay

## 2020-12-16 DIAGNOSIS — F4323 Adjustment disorder with mixed anxiety and depressed mood: Secondary | ICD-10-CM

## 2020-12-16 DIAGNOSIS — F1290 Cannabis use, unspecified, uncomplicated: Secondary | ICD-10-CM | POA: Insufficient documentation

## 2020-12-16 NOTE — Discharge Instructions (Signed)
Therapy Walk-in Hours  Monday-Wednesday: 8 AM until slots are full  Friday: 1 PM to 5 PM  For Monday to Wednesday, it is recommended that patients arrive between 7:30 AM and 7:45 AM because patients will be seen in the order of arrival.  For Friday, we ask that patients arrive between 12 PM to 12:30 PM.  Go to the second floor on arrival and check in.  **Availability is limited; therefore, patients may not be seen on the same day.**  Medication management walk-ins:  Monday to Friday: 8 AM to 11 AM.  It is recommended that patients arrive by 7:30 AM to 7:45 AM because patients will be seen in the order of arrival.  Go to the second floor on arrival and check in.  **Availability is limited; therefore, patients may not be seen on the same day.**    Discharge recommendations:  Patient is to take medications as prescribed. Please see information for follow-up appointment with psychiatry and therapy. Please follow up with your primary care provider for all medical related needs.   Therapy: We recommend that patient participate in individual therapy to address mental health concerns.  Safety:  The patient should abstain from use of illicit substances/drugs and abuse of any medications. If symptoms worsen or do not continue to improve or if the patient becomes actively suicidal or homicidal then it is recommended that the patient return to the closest hospital emergency department, the [facility], or call 911 for further evaluation and treatment. National Suicide Prevention Lifeline 1-800-SUICIDE or (443)496-4507.

## 2020-12-16 NOTE — ED Provider Notes (Signed)
Behavioral Health Urgent Care Medical Screening Exam  Patient Name: James Wise MRN: [id number] Date of Evaluation: 12/16/20 Chief Complaint:   Diagnosis:  Final diagnoses:  Adjustment disorder with mixed anxiety and depressed mood    History of Present illness: James Wise is a 40 y.o. male who presents to [facility] for an evaluation. Patient states that he has been evaluated twice at [facility] over the past week and was told to return to [facility] the morning of 12/17/2020 as a walk-in for medication management. On evaluation patient is alert and oriented x 4, pleasant, and cooperative. Speech is clear and coherent. Mood is depressed/anxious and affect is congruent with mood. Thought process is coherent and thought content is logical. Denies auditory and hallucinations. No indication that patient is responding to internal stimuli. Denies suicidal ideations. Denies homicidal ideations. Reports marijuana use. Denies use of other substances. Reports ongoing conflict with a Radio broadcast assistant. See notes from 12/12/2020 and 12/13/2020.   Psychiatric Specialty Exam  Presentation  General Appearance:Appropriate for Environment; Neat  Eye Contact:Good  Speech:Clear and Coherent; Normal Rate  Speech Volume:Normal  Handedness:Right   Mood and Affect  Mood:Dysphoric; Anxious  Affect:Congruent   Thought Process  Thought Processes:Coherent; Linear  Descriptions of Associations:Intact  Orientation:Full (Time, Place and Person)  Thought Content:WDL; Logical  Diagnosis of Schizophrenia or Schizoaffective disorder in past: No   Hallucinations:None  Ideas of Reference:None  Suicidal Thoughts:No  Homicidal Thoughts:No   Sensorium  Memory:Immediate Good; Recent Good; Remote Good  Judgment:Fair  Insight:Fair   Executive Functions  Concentration:Fair  Attention Span:Fair  Recall:Fair  Fund of Knowledge:Fair  Language:Good   Psychomotor Activity  Psychomotor  Activity:Normal   Assets  Assets:Communication Skills; Desire for Improvement; Housing; Financial Resources/Insurance   Sleep  Sleep:Poor  Number of hours:  No data recorded  No data recorded  Physical Exam: Physical Exam Constitutional:      General: He is not in acute distress.    Appearance: He is not ill-appearing, toxic-appearing or diaphoretic.  HENT:     Head: Normocephalic.     Right Ear: External ear normal.     Left Ear: External ear normal.  Eyes:     Pupils: Pupils are equal, round, and reactive to light.  Cardiovascular:     Rate and Rhythm: Normal rate.  Pulmonary:     Effort: Pulmonary effort is normal. No respiratory distress.  Musculoskeletal:        General: Normal range of motion.  Neurological:     Mental Status: He is alert and oriented to person, place, and time.  Psychiatric:        Mood and Affect: Mood is anxious and depressed.        Speech: Speech normal.        Behavior: Behavior is cooperative.        Thought Content: Thought content does not include homicidal or suicidal ideation. Thought content does not include suicidal plan.   Review of Systems  Constitutional:  Negative for chills, diaphoresis, fever, malaise/fatigue and weight loss.  HENT:  Negative for congestion.   Respiratory:  Negative for cough and shortness of breath.   Cardiovascular:  Negative for chest pain and palpitations.  Gastrointestinal:  Negative for diarrhea, nausea and vomiting.  Neurological:  Negative for dizziness and seizures.  Psychiatric/Behavioral:  Positive for depression and substance abuse. Negative for hallucinations, memory loss and suicidal ideas. The patient is nervous/anxious and has insomnia.   All other systems reviewed and are negative. Blood pressure Marland Kitchen)  146/98, pulse (!) 57, temperature 98.8 F (37.1 C), temperature source Oral, resp. rate 18, SpO2 100 %. There is no height or weight on file to calculate BMI.  Musculoskeletal: Strength &  Muscle Tone: within normal limits Gait & Station: normal Patient leans: N/A   [facility] MSE Discharge Disposition for Follow up and Recommendations: Based on my evaluation the patient does not appear to have an emergency medical condition and can be discharged with resources and follow up care in outpatient services for Medication Management and Individual Therapy   Jackelyn Poling, NP 12/16/2020, 9:44 PM

## 2020-12-16 NOTE — BH Assessment (Signed)
James Wise, Routine, MR #[id number]; 40 year old presents this date alone and unaccompanied.  Pt denied SI, HI, or AVH.  Pt reports "I feel like I flew her and someone is is trying to set me up".   Pt admits to prior MH diagnosis; also, reports that he needs his prescribed medication for symptom management. Pt reports that he has an appointment scheduled for Behavioral Health on 12/17/20.  MSE signed by patient.

## 2020-12-26 ENCOUNTER — Ambulatory Visit (INDEPENDENT_AMBULATORY_CARE_PROVIDER_SITE_OTHER): Payer: Medicaid Other | Admitting: Physician Assistant

## 2020-12-26 ENCOUNTER — Encounter (HOSPITAL_COMMUNITY): Payer: Self-pay | Admitting: Family Medicine

## 2020-12-26 ENCOUNTER — Ambulatory Visit (HOSPITAL_COMMUNITY)
Admission: EM | Admit: 2020-12-26 | Discharge: 2020-12-26 | Disposition: A | Discharge status: Home / Self Care | Payer: Medicaid Other

## 2020-12-26 ENCOUNTER — Encounter (HOSPITAL_COMMUNITY): Payer: Self-pay | Admitting: Physician Assistant

## 2020-12-26 ENCOUNTER — Other Ambulatory Visit: Payer: Self-pay

## 2020-12-26 VITALS — BP 147/95 | HR 63 | Ht 73.0 in | Wt 182.0 lb

## 2020-12-26 DIAGNOSIS — F4310 Post-traumatic stress disorder, unspecified: Secondary | ICD-10-CM | POA: Diagnosis not present

## 2020-12-26 DIAGNOSIS — G4700 Insomnia, unspecified: Secondary | ICD-10-CM

## 2020-12-26 DIAGNOSIS — F22 Delusional disorders: Secondary | ICD-10-CM | POA: Diagnosis not present

## 2020-12-26 DIAGNOSIS — F411 Generalized anxiety disorder: Secondary | ICD-10-CM

## 2020-12-26 DIAGNOSIS — F331 Major depressive disorder, recurrent, moderate: Secondary | ICD-10-CM | POA: Insufficient documentation

## 2020-12-26 MED ORDER — BUPROPION HCL ER (XL) 150 MG PO TB24: 150.0000 mg | ORAL_TABLET | ORAL | 2 refills | Status: AC

## 2020-12-26 MED ORDER — QUETIAPINE FUMARATE 50 MG PO TABS: 50.0000 mg | ORAL_TABLET | Freq: Every day | ORAL | 1 refills | Status: AC

## 2020-12-26 MED ORDER — BUSPIRONE HCL 7.5 MG PO TABS: 7.5000 mg | ORAL_TABLET | Freq: Two times a day (BID) | ORAL | 1 refills | Status: AC

## 2020-12-26 NOTE — ED Notes (Signed)
Pt walked out unbeknownst to staff.
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
## 2020-12-26 NOTE — ED Provider Notes (Signed)
Behavioral Health Urgent Care Medical Screening Exam  Patient Name: James Wise MRN: [id number] Date of Evaluation: 12/26/20 Chief Complaint:   Diagnosis:   Patient left prior to being seen by provider. Patient denied SI/HI/AVH on triage.   Jackelyn Poling, NP 12/26/2020, 1:43 AM
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
## 2020-12-29 ENCOUNTER — Encounter (HOSPITAL_COMMUNITY): Payer: Self-pay | Admitting: Physician Assistant

## 2021-02-07 ENCOUNTER — Encounter (HOSPITAL_COMMUNITY): Payer: Medicaid Other | Admitting: Physician Assistant

## 2021-02-19 ENCOUNTER — Ambulatory Visit (HOSPITAL_COMMUNITY): Payer: Medicaid Other | Admitting: Physician Assistant

## 2021-02-24 ENCOUNTER — Ambulatory Visit (HOSPITAL_COMMUNITY): Payer: Medicaid Other | Admitting: Licensed Clinical Social Worker

## 2021-02-24 ENCOUNTER — Telehealth (HOSPITAL_COMMUNITY): Payer: Self-pay | Admitting: Licensed Clinical Social Worker

## 2021-02-24 NOTE — Telephone Encounter (Signed)
This LCSW had to unexpectedly work remotely today. Admin staff called pt to advise of virtual session, reportedly unable to lvm. LCSW sent text message with link for video session per schedule. LCSW remained online for session until 11:10. Pt failed to sign on for session.
